# Patient Record
Sex: Female | Born: 1935 | Race: White | Hispanic: No | Smoking: Never smoker
Health system: Southern US, Community
[De-identification: ages and names within clinical notes are randomized; demographics above are authoritative.]

## PROBLEM LIST (undated history)

## (undated) DIAGNOSIS — I10 Essential (primary) hypertension: Secondary | ICD-10-CM

## (undated) DIAGNOSIS — K219 Gastro-esophageal reflux disease without esophagitis: Secondary | ICD-10-CM

## (undated) HISTORY — PX: BLADDER REPAIR: SHX76

## (undated) HISTORY — PX: ABDOMINAL HYSTERECTOMY: SHX81

---

## 2006-06-22 ENCOUNTER — Ambulatory Visit (HOSPITAL_COMMUNITY): Admission: RE | Admit: 2006-06-22 | Discharge: 2006-06-22 | Payer: Self-pay | Admitting: Ophthalmology

## 2006-07-20 ENCOUNTER — Ambulatory Visit (HOSPITAL_COMMUNITY): Admission: RE | Admit: 2006-07-20 | Discharge: 2006-07-20 | Payer: Self-pay | Admitting: Ophthalmology

## 2015-09-30 DIAGNOSIS — I10 Essential (primary) hypertension: Secondary | ICD-10-CM | POA: Diagnosis not present

## 2015-09-30 DIAGNOSIS — M1990 Unspecified osteoarthritis, unspecified site: Secondary | ICD-10-CM | POA: Diagnosis not present

## 2015-12-31 DIAGNOSIS — I10 Essential (primary) hypertension: Secondary | ICD-10-CM | POA: Diagnosis not present

## 2015-12-31 DIAGNOSIS — E7800 Pure hypercholesterolemia, unspecified: Secondary | ICD-10-CM | POA: Diagnosis not present

## 2015-12-31 DIAGNOSIS — M159 Polyosteoarthritis, unspecified: Secondary | ICD-10-CM | POA: Diagnosis not present

## 2016-01-02 DIAGNOSIS — Z87891 Personal history of nicotine dependence: Secondary | ICD-10-CM | POA: Diagnosis not present

## 2016-01-02 DIAGNOSIS — F419 Anxiety disorder, unspecified: Secondary | ICD-10-CM | POA: Diagnosis not present

## 2016-01-02 DIAGNOSIS — I10 Essential (primary) hypertension: Secondary | ICD-10-CM | POA: Diagnosis not present

## 2016-01-02 DIAGNOSIS — Z6837 Body mass index (BMI) 37.0-37.9, adult: Secondary | ICD-10-CM | POA: Diagnosis not present

## 2016-01-02 DIAGNOSIS — M1990 Unspecified osteoarthritis, unspecified site: Secondary | ICD-10-CM | POA: Diagnosis not present

## 2016-01-02 DIAGNOSIS — N183 Chronic kidney disease, stage 3 (moderate): Secondary | ICD-10-CM | POA: Diagnosis not present

## 2016-01-20 DIAGNOSIS — E7800 Pure hypercholesterolemia, unspecified: Secondary | ICD-10-CM | POA: Diagnosis not present

## 2016-01-20 DIAGNOSIS — I10 Essential (primary) hypertension: Secondary | ICD-10-CM | POA: Diagnosis not present

## 2016-01-20 DIAGNOSIS — M159 Polyosteoarthritis, unspecified: Secondary | ICD-10-CM | POA: Diagnosis not present

## 2016-02-09 DIAGNOSIS — Z1211 Encounter for screening for malignant neoplasm of colon: Secondary | ICD-10-CM | POA: Diagnosis not present

## 2016-02-09 DIAGNOSIS — Z0000 Encounter for general adult medical examination without abnormal findings: Secondary | ICD-10-CM | POA: Diagnosis not present

## 2016-02-09 DIAGNOSIS — Z1389 Encounter for screening for other disorder: Secondary | ICD-10-CM | POA: Diagnosis not present

## 2016-02-09 DIAGNOSIS — Z7189 Other specified counseling: Secondary | ICD-10-CM | POA: Diagnosis not present

## 2016-02-09 DIAGNOSIS — Z6837 Body mass index (BMI) 37.0-37.9, adult: Secondary | ICD-10-CM | POA: Diagnosis not present

## 2016-02-09 DIAGNOSIS — Z299 Encounter for prophylactic measures, unspecified: Secondary | ICD-10-CM | POA: Diagnosis not present

## 2016-02-13 DIAGNOSIS — R5383 Other fatigue: Secondary | ICD-10-CM | POA: Diagnosis not present

## 2016-02-13 DIAGNOSIS — Z79899 Other long term (current) drug therapy: Secondary | ICD-10-CM | POA: Diagnosis not present

## 2016-02-13 DIAGNOSIS — E7800 Pure hypercholesterolemia, unspecified: Secondary | ICD-10-CM | POA: Diagnosis not present

## 2016-02-27 DIAGNOSIS — J069 Acute upper respiratory infection, unspecified: Secondary | ICD-10-CM | POA: Diagnosis not present

## 2016-02-27 DIAGNOSIS — Z299 Encounter for prophylactic measures, unspecified: Secondary | ICD-10-CM | POA: Diagnosis not present

## 2016-02-27 DIAGNOSIS — Z789 Other specified health status: Secondary | ICD-10-CM | POA: Diagnosis not present

## 2016-05-04 DIAGNOSIS — E2839 Other primary ovarian failure: Secondary | ICD-10-CM | POA: Diagnosis not present

## 2016-05-13 DIAGNOSIS — K219 Gastro-esophageal reflux disease without esophagitis: Secondary | ICD-10-CM | POA: Diagnosis not present

## 2016-05-13 DIAGNOSIS — Z79899 Other long term (current) drug therapy: Secondary | ICD-10-CM | POA: Diagnosis not present

## 2016-05-13 DIAGNOSIS — K649 Unspecified hemorrhoids: Secondary | ICD-10-CM | POA: Diagnosis not present

## 2016-05-13 DIAGNOSIS — I10 Essential (primary) hypertension: Secondary | ICD-10-CM | POA: Diagnosis not present

## 2016-05-21 ENCOUNTER — Emergency Department (HOSPITAL_COMMUNITY): Payer: Medicare Other

## 2016-05-21 ENCOUNTER — Emergency Department (HOSPITAL_COMMUNITY)
Admission: EM | Admit: 2016-05-21 | Discharge: 2016-05-21 | Disposition: A | Discharge status: Home / Self Care | Payer: Medicare Other | Attending: Emergency Medicine | Admitting: Emergency Medicine

## 2016-05-21 ENCOUNTER — Encounter (HOSPITAL_COMMUNITY): Payer: Self-pay | Admitting: Emergency Medicine

## 2016-05-21 DIAGNOSIS — K649 Unspecified hemorrhoids: Secondary | ICD-10-CM | POA: Diagnosis not present

## 2016-05-21 DIAGNOSIS — K644 Residual hemorrhoidal skin tags: Secondary | ICD-10-CM | POA: Diagnosis not present

## 2016-05-21 DIAGNOSIS — I10 Essential (primary) hypertension: Secondary | ICD-10-CM | POA: Insufficient documentation

## 2016-05-21 DIAGNOSIS — K5730 Diverticulosis of large intestine without perforation or abscess without bleeding: Secondary | ICD-10-CM | POA: Diagnosis not present

## 2016-05-21 DIAGNOSIS — Z79899 Other long term (current) drug therapy: Secondary | ICD-10-CM | POA: Diagnosis not present

## 2016-05-21 DIAGNOSIS — K921 Melena: Secondary | ICD-10-CM

## 2016-05-21 DIAGNOSIS — K625 Hemorrhage of anus and rectum: Secondary | ICD-10-CM | POA: Diagnosis present

## 2016-05-21 DIAGNOSIS — R197 Diarrhea, unspecified: Secondary | ICD-10-CM | POA: Diagnosis not present

## 2016-05-21 HISTORY — DX: Essential (primary) hypertension: I10

## 2016-05-21 HISTORY — DX: Gastro-esophageal reflux disease without esophagitis: K21.9

## 2016-05-21 LAB — COMPREHENSIVE METABOLIC PANEL
ALBUMIN: 3.4 g/dL — AB (ref 3.5–5.0)
ALT: 14 U/L (ref 14–54)
ANION GAP: 9 (ref 5–15)
AST: 23 U/L (ref 15–41)
Alkaline Phosphatase: 50 U/L (ref 38–126)
BUN: 14 mg/dL (ref 6–20)
CO2: 31 mmol/L (ref 22–32)
Calcium: 9.2 mg/dL (ref 8.9–10.3)
Chloride: 95 mmol/L — ABNORMAL LOW (ref 101–111)
Creatinine, Ser: 1.27 mg/dL — ABNORMAL HIGH (ref 0.44–1.00)
GFR calc Af Amer: 42 mL/min — ABNORMAL LOW (ref >60)
GFR calc non Af Amer: 36 mL/min — ABNORMAL LOW (ref >60)
GLUCOSE: 114 mg/dL — AB (ref 65–99)
POTASSIUM: 3.3 mmol/L — AB (ref 3.5–5.1)
SODIUM: 135 mmol/L (ref 135–145)
Total Bilirubin: 0.5 mg/dL (ref 0.3–1.2)
Total Protein: 6.3 g/dL — ABNORMAL LOW (ref 6.5–8.1)

## 2016-05-21 LAB — CBC WITH DIFFERENTIAL/PLATELET
BASOS ABS: 0 10*3/uL (ref 0.0–0.1)
BASOS PCT: 0 %
EOS ABS: 0.1 10*3/uL (ref 0.0–0.7)
Eosinophils Relative: 1 %
HEMATOCRIT: 39.6 % (ref 36.0–46.0)
Hemoglobin: 12.8 g/dL (ref 12.0–15.0)
Lymphocytes Relative: 15 %
Lymphs Abs: 2 10*3/uL (ref 0.7–4.0)
MCH: 29.4 pg (ref 26.0–34.0)
MCHC: 32.3 g/dL (ref 30.0–36.0)
MCV: 90.8 fL (ref 78.0–100.0)
MONO ABS: 1.3 10*3/uL — AB (ref 0.1–1.0)
Monocytes Relative: 10 %
NEUTROS ABS: 9.7 10*3/uL — AB (ref 1.7–7.7)
NEUTROS PCT: 74 %
Platelets: 310 10*3/uL (ref 150–400)
RBC: 4.36 MIL/uL (ref 3.87–5.11)
RDW: 13.6 % (ref 11.5–15.5)
WBC: 13.1 10*3/uL — ABNORMAL HIGH (ref 4.0–10.5)

## 2016-05-21 LAB — PROTIME-INR
INR: 0.99
PROTHROMBIN TIME: 13.1 s (ref 11.4–15.2)

## 2016-05-21 LAB — TYPE AND SCREEN
ABO/RH(D): A POS
Antibody Screen: NEGATIVE

## 2016-05-21 LAB — POC OCCULT BLOOD, ED: FECAL OCCULT BLD: POSITIVE — AB

## 2016-05-21 MED ORDER — TRAMADOL HCL 50 MG PO TABS: 25.0000 mg | ORAL_TABLET | Freq: Four times a day (QID) | ORAL | 0 refills | Status: AC | PRN

## 2016-05-21 MED ORDER — IOPAMIDOL (ISOVUE-300) INJECTION 61%
INTRAVENOUS | Status: AC
  Administered 2016-05-21: 100 mL
  Filled 2016-05-21: qty 30

## 2016-05-21 MED ORDER — PRAMOXINE HCL 1 % RE FOAM: 1.0000 "application " | Freq: Three times a day (TID) | RECTAL | 2 refills | Status: AC | PRN

## 2016-05-21 NOTE — ED Triage Notes (Signed)
Pt states she has been having black tarry stools x one week. Pt was seen for the same at [facility] this week. PCP sent here for rectal bleeding.

## 2016-05-21 NOTE — ED Provider Notes (Signed)
[facility] DEPT Provider Note   CSN: [id number] Arrival date & time: 05/21/16  1857     History   Chief Complaint Chief Complaint  Patient presents with  . Rectal Bleeding    HPI Brandy Reid is a 82 y.o. female.   Diarrhea   This is a new problem. The current episode started more than 2 days ago. The problem occurs 2 to 4 times per day. The problem has not changed since onset.The stool consistency is described as watery (dark). There has been no fever. Pertinent negatives include no abdominal pain, no vomiting and no chills. She has tried anti-motility drugs, a change of diet and analgesics for the symptoms. Her past medical history does not include irritable bowel syndrome.    Past Medical History:  Diagnosis Date  . GERD (gastroesophageal reflux disease)   . Hypertension     There are no active problems to display for this patient.   Past Surgical History:  Procedure Laterality Date  . ABDOMINAL HYSTERECTOMY    . BLADDER REPAIR      OB History    No data available       Home Medications    Prior to Admission medications   Medication Sig Start Date End Date Taking? Authorizing Provider  clonazePAM (KLONOPIN) 0.5 MG tablet Take 0.5 mg by mouth at bedtime.   Yes Historical Provider, MD  docusate sodium (COLACE) 100 MG capsule Take 100 mg by mouth daily.   Yes Historical Provider, MD  hydrocortisone (ANUSOL-HC) 25 MG suppository Place 25 mg rectally 2 (two) times daily as needed for hemorrhoids or itching.   Yes Historical Provider, MD  ibuprofen (ADVIL,MOTRIN) 200 MG tablet Take 400 mg by mouth 2 (two) times daily as needed for moderate pain.    Yes Historical Provider, MD  lisinopril-hydrochlorothiazide (PRINZIDE,ZESTORETIC) 10-12.5 MG tablet Take 1 tablet by mouth daily.   Yes Historical Provider, MD  omeprazole (PRILOSEC) 20 MG capsule Take 20 mg by mouth daily with supper.   Yes Historical Provider, MD  senna-docusate (SENNALAX-S) 8.6-50 MG tablet Take  1 tablet by mouth daily.   Yes Historical Provider, MD  pramoxine (PROCTOFOAM) 1 % foam Place 1 application rectally 3 (three) times daily as needed for itching. 05/21/16   Merrily Pew, MD  traMADol (ULTRAM) 50 MG tablet Take 0.5 tablets (25 mg total) by mouth every 6 (six) hours as needed. 05/21/16   Merrily Pew, MD    Family History History reviewed. No pertinent family history.  Social History Social History  Substance Use Topics  . Smoking status: Never Smoker  . Smokeless tobacco: Never Used  . Alcohol use No     Allergies   Review of patient's allergies indicates no known allergies.   Review of Systems Review of Systems  Constitutional: Negative for chills.  Gastrointestinal: Positive for diarrhea and rectal pain. Negative for abdominal pain and vomiting.  All other systems reviewed and are negative.    Physical Exam Updated Vital Signs BP 144/73   Pulse 86   Temp 97.4 F (36.3 C) (Oral)   Resp 18   SpO2 96%   Physical Exam  Constitutional: She appears well-developed and well-nourished.  HENT:  Head: Normocephalic and atraumatic.  Eyes: Conjunctivae and EOM are normal.  Neck: Normal range of motion.  Cardiovascular: Normal rate and regular rhythm.  Exam reveals no friction rub.   No murmur heard. Pulmonary/Chest: Effort normal. No stridor. No respiratory distress.  Abdominal: Soft. She exhibits no distension and no  mass. There is no tenderness.  Genitourinary: Rectal exam shows external hemorrhoid (tender but not thrombosed) and guaiac positive stool (faintly positive).  Musculoskeletal: Normal range of motion. She exhibits no edema or deformity.  Neurological: She is alert.  Skin: Skin is warm and dry.  Nursing note and vitals reviewed.    ED Treatments / Results  Labs (all labs ordered are listed, but only abnormal results are displayed) Labs Reviewed  COMPREHENSIVE METABOLIC PANEL - Abnormal; Notable for the following:       Result Value    Potassium 3.3 (*)    Chloride 95 (*)    Glucose, Bld 114 (*)    Creatinine, Ser 1.27 (*)    Total Protein 6.3 (*)    Albumin 3.4 (*)    GFR calc non Af Amer 36 (*)    GFR calc Af Amer 42 (*)    All other components within normal limits  CBC WITH DIFFERENTIAL/PLATELET - Abnormal; Notable for the following:    WBC 13.1 (*)    Neutro Abs 9.7 (*)    Monocytes Absolute 1.3 (*)    All other components within normal limits  POC OCCULT BLOOD, ED - Abnormal; Notable for the following:    Fecal Occult Bld POSITIVE (*)    All other components within normal limits  PROTIME-INR  TYPE AND SCREEN    EKG  EKG Interpretation None       Radiology Ct Abdomen Pelvis W Contrast  Result Date: 05/21/2016 CLINICAL DATA:  82 year old female with lack scarring stool. EXAM: CT ABDOMEN AND PELVIS WITH CONTRAST TECHNIQUE: Multidetector CT imaging of the abdomen and pelvis was performed using the standard protocol following bolus administration of intravenous contrast. CONTRAST:  142mL ISOVUE-300 IOPAMIDOL (ISOVUE-300) INJECTION 61% COMPARISON:  None. FINDINGS: Lower chest: The visualized lung bases are clear. There is mild cardiomegaly. Coronary vascular calcification noted. No intra-abdominal free air or free fluid. Hepatobiliary: Cholecystectomy. The liver is unremarkable. No intrahepatic biliary ductal dilatation. Pancreas: Unremarkable. No pancreatic ductal dilatation or surrounding inflammatory changes. Spleen: Normal in size without focal abnormality. Adrenals/Urinary Tract: The adrenal glands appear unremarkable. There is moderate bilateral renal parenchymal atrophy and cortical thinning. Bilateral extrarenal pelvis. No stone identified. There is mild right pelvocaliectasis correlation with urinalysis recommended to exclude UTI. There is no hydronephrosis on the left. The urinary bladder appears unremarkable. Stomach/Bowel: There is a moderate size paraesophageal hiatal hernia containing portion of the  stomach with possible gastric dialysis. No inflammatory changes, fluid collection, or evidence of gastric outlet obstruction.Moderate amount of dense stool noted throughout the colon. There is extensive colonic diverticulosis with a segment of muscular hypertrophy in the sigmoid colon. No definite active inflammatory changes. There is no evidence of bowel obstruction. The appendix is not visualized with certainty. No inflammatory changes identified in the right lower quadrant. Vascular/Lymphatic: There is advanced aortoiliac atherosclerotic disease. The origins of the celiac axis, SMA, IMA appear patent. The SMV, splenic vein, and main portal vein are patent. No portal venous gas identified. There is no adenopathy. Reproductive: Hysterectomy. Other: None Musculoskeletal: there is scoliosis with degenerative changes of the spine. Multilevel disc desiccation with vacuum phenomena. Grade 1 L4-L5 anterolisthesis. No acute fracture. IMPRESSION: No acute intra-abdominal or pelvic pathology. Extensive colonic diverticulosis without definite active inflammatory changes. Constipation.  No evidence of bowel obstruction. Moderate size hiatal hernia containing portion of the stomach with possible gastric volvulus with no inflammatory changes or evidence of gastric outlet obstruction. Mild right renal pelvocaliectasis. Correlation with urinalysis recommended  to exclude UTI. Electronically Signed   By: Anner Crete M.D.   On: 05/21/2016 22:32    Procedures Procedures (including critical care time)  Medications Ordered in ED Medications  iopamidol (ISOVUE-300) 61 % injection (100 mLs  Contrast Given 05/21/16 2143)     Initial Impression / Assessment and Plan / ED Course  I have reviewed the triage vital signs and the nursing notes.  Pertinent labs & imaging results that were available during my care of the patient were reviewed by me and considered in my medical decision making (see chart for  details).  Clinical Course    Here with a few days of diarrhea and painful defecation 2/2 hemorrhoids. Stools dark but had taken pepto recently. Faintly positive for blood but no hemodynamic instability, tachycardia, anemia or blood thinners to warrant emergent gi eval. hemrrhoids not thrombosed so no emergent intervention. After long discussion with family, will get try to have care management get a nurse aid/PT into the house to help patient with ADL's, she needs urgent Gi follow up which they will arrange. Will also return here for new/worsening symptoms.   Final Clinical Impressions(s) / ED Diagnoses   Final diagnoses:  Hemorrhoids, unspecified hemorrhoid type  Melena    New Prescriptions Discharge Medication List as of 05/21/2016 11:08 PM    START taking these medications   Details  pramoxine (PROCTOFOAM) 1 % foam Place 1 application rectally 3 (three) times daily as needed for itching., Starting Fri 05/21/2016, Print    traMADol (ULTRAM) 50 MG tablet Take 0.5 tablets (25 mg total) by mouth every 6 (six) hours as needed., Starting Fri 05/21/2016, Print         Merrily Pew, MD 05/22/16 1141

## 2016-05-26 ENCOUNTER — Ambulatory Visit (INDEPENDENT_AMBULATORY_CARE_PROVIDER_SITE_OTHER): Payer: Medicare Other | Admitting: Internal Medicine

## 2016-07-21 DIAGNOSIS — I10 Essential (primary) hypertension: Secondary | ICD-10-CM | POA: Diagnosis not present

## 2016-07-21 DIAGNOSIS — E7800 Pure hypercholesterolemia, unspecified: Secondary | ICD-10-CM | POA: Diagnosis not present

## 2016-07-21 DIAGNOSIS — M159 Polyosteoarthritis, unspecified: Secondary | ICD-10-CM | POA: Diagnosis not present

## 2016-11-22 DIAGNOSIS — I10 Essential (primary) hypertension: Secondary | ICD-10-CM | POA: Diagnosis not present

## 2016-11-22 DIAGNOSIS — M159 Polyosteoarthritis, unspecified: Secondary | ICD-10-CM | POA: Diagnosis not present

## 2016-11-22 DIAGNOSIS — E7800 Pure hypercholesterolemia, unspecified: Secondary | ICD-10-CM | POA: Diagnosis not present

## 2016-12-22 DIAGNOSIS — I10 Essential (primary) hypertension: Secondary | ICD-10-CM | POA: Diagnosis not present

## 2016-12-22 DIAGNOSIS — E7800 Pure hypercholesterolemia, unspecified: Secondary | ICD-10-CM | POA: Diagnosis not present

## 2016-12-22 DIAGNOSIS — M159 Polyosteoarthritis, unspecified: Secondary | ICD-10-CM | POA: Diagnosis not present

## 2017-02-18 DIAGNOSIS — Z79899 Other long term (current) drug therapy: Secondary | ICD-10-CM | POA: Diagnosis not present

## 2017-02-18 DIAGNOSIS — R5383 Other fatigue: Secondary | ICD-10-CM | POA: Diagnosis not present

## 2017-02-18 DIAGNOSIS — Z7189 Other specified counseling: Secondary | ICD-10-CM | POA: Diagnosis not present

## 2017-02-18 DIAGNOSIS — Z6834 Body mass index (BMI) 34.0-34.9, adult: Secondary | ICD-10-CM | POA: Diagnosis not present

## 2017-02-18 DIAGNOSIS — E7800 Pure hypercholesterolemia, unspecified: Secondary | ICD-10-CM | POA: Diagnosis not present

## 2017-02-18 DIAGNOSIS — Z1389 Encounter for screening for other disorder: Secondary | ICD-10-CM | POA: Diagnosis not present

## 2017-02-18 DIAGNOSIS — Z789 Other specified health status: Secondary | ICD-10-CM | POA: Diagnosis not present

## 2017-02-18 DIAGNOSIS — Z0000 Encounter for general adult medical examination without abnormal findings: Secondary | ICD-10-CM | POA: Diagnosis not present

## 2017-02-18 DIAGNOSIS — I10 Essential (primary) hypertension: Secondary | ICD-10-CM | POA: Diagnosis not present

## 2017-02-18 DIAGNOSIS — N183 Chronic kidney disease, stage 3 (moderate): Secondary | ICD-10-CM | POA: Diagnosis not present

## 2017-02-18 DIAGNOSIS — Z299 Encounter for prophylactic measures, unspecified: Secondary | ICD-10-CM | POA: Diagnosis not present

## 2017-02-18 DIAGNOSIS — F419 Anxiety disorder, unspecified: Secondary | ICD-10-CM | POA: Diagnosis not present

## 2017-02-21 DIAGNOSIS — M159 Polyosteoarthritis, unspecified: Secondary | ICD-10-CM | POA: Diagnosis not present

## 2017-02-21 DIAGNOSIS — I10 Essential (primary) hypertension: Secondary | ICD-10-CM | POA: Diagnosis not present

## 2017-02-21 DIAGNOSIS — E7800 Pure hypercholesterolemia, unspecified: Secondary | ICD-10-CM | POA: Diagnosis not present

## 2017-03-15 DIAGNOSIS — R5383 Other fatigue: Secondary | ICD-10-CM | POA: Diagnosis not present

## 2017-03-15 DIAGNOSIS — Z79899 Other long term (current) drug therapy: Secondary | ICD-10-CM | POA: Diagnosis not present

## 2017-03-15 DIAGNOSIS — E7800 Pure hypercholesterolemia, unspecified: Secondary | ICD-10-CM | POA: Diagnosis not present

## 2017-03-15 DIAGNOSIS — F419 Anxiety disorder, unspecified: Secondary | ICD-10-CM | POA: Diagnosis not present

## 2017-03-25 DIAGNOSIS — I10 Essential (primary) hypertension: Secondary | ICD-10-CM | POA: Diagnosis not present

## 2017-03-25 DIAGNOSIS — M159 Polyosteoarthritis, unspecified: Secondary | ICD-10-CM | POA: Diagnosis not present

## 2017-03-25 DIAGNOSIS — E7800 Pure hypercholesterolemia, unspecified: Secondary | ICD-10-CM | POA: Diagnosis not present

## 2017-03-28 DIAGNOSIS — I340 Nonrheumatic mitral (valve) insufficiency: Secondary | ICD-10-CM | POA: Diagnosis not present

## 2017-03-28 DIAGNOSIS — I350 Nonrheumatic aortic (valve) stenosis: Secondary | ICD-10-CM | POA: Diagnosis not present

## 2017-05-23 DIAGNOSIS — I10 Essential (primary) hypertension: Secondary | ICD-10-CM | POA: Diagnosis not present

## 2017-05-23 DIAGNOSIS — M159 Polyosteoarthritis, unspecified: Secondary | ICD-10-CM | POA: Diagnosis not present

## 2017-05-23 DIAGNOSIS — E7800 Pure hypercholesterolemia, unspecified: Secondary | ICD-10-CM | POA: Diagnosis not present

## 2017-06-02 DIAGNOSIS — M159 Polyosteoarthritis, unspecified: Secondary | ICD-10-CM | POA: Diagnosis not present

## 2017-06-02 DIAGNOSIS — E7800 Pure hypercholesterolemia, unspecified: Secondary | ICD-10-CM | POA: Diagnosis not present

## 2017-06-02 DIAGNOSIS — I10 Essential (primary) hypertension: Secondary | ICD-10-CM | POA: Diagnosis not present

## 2017-11-08 DIAGNOSIS — I10 Essential (primary) hypertension: Secondary | ICD-10-CM | POA: Diagnosis not present

## 2017-11-08 DIAGNOSIS — E7800 Pure hypercholesterolemia, unspecified: Secondary | ICD-10-CM | POA: Diagnosis not present

## 2017-11-08 DIAGNOSIS — M159 Polyosteoarthritis, unspecified: Secondary | ICD-10-CM | POA: Diagnosis not present

## 2018-01-31 IMAGING — CT CT ABD-PELV W/ CM
2 of 5 series · 16 of 46 positions shown, 18 images · IV contrast (APPLIED)
Comparison: None.

CLINICAL DATA: [AGE] female with lack scarring stool.

EXAM:
CT ABDOMEN AND PELVIS WITH CONTRAST
TECHNIQUE: Multidetector CT imaging of the abdomen and pelvis was performed
using the standard protocol following bolus administration of
intravenous contrast.
CONTRAST:  100mL RQAJ1U-YMM IOPAMIDOL (RQAJ1U-YMM) INJECTION 61%

[Series 2: axial st · axial · 0.82mm/px · z∈[+598,+968]mm · 13 of 84 slices shown, 15 images]
[im 5/84  soft-tissue]
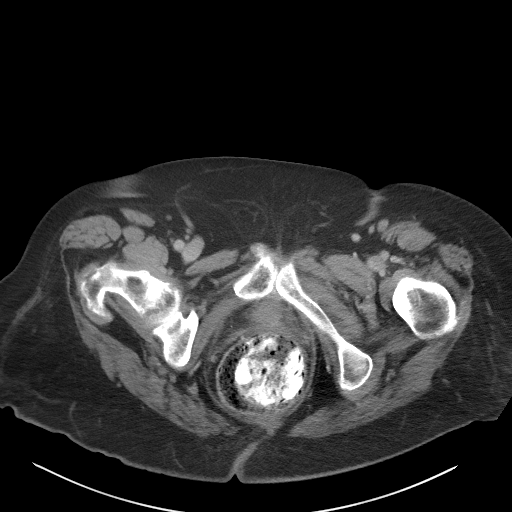
[im 5/84  bone]
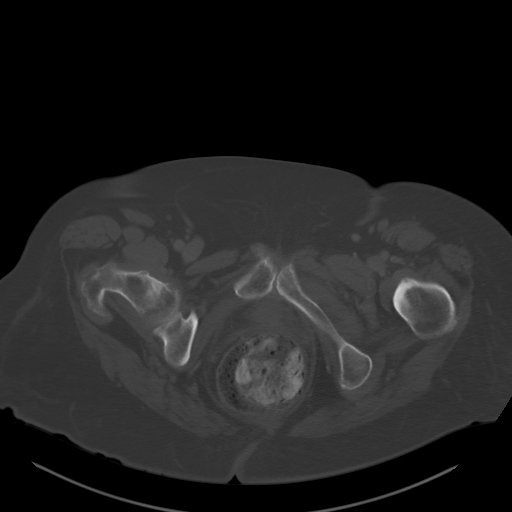
[im 10/84  soft-tissue]
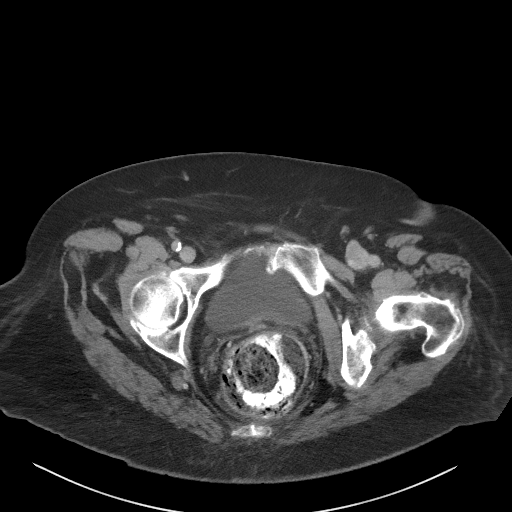
[im 20/84  soft-tissue]
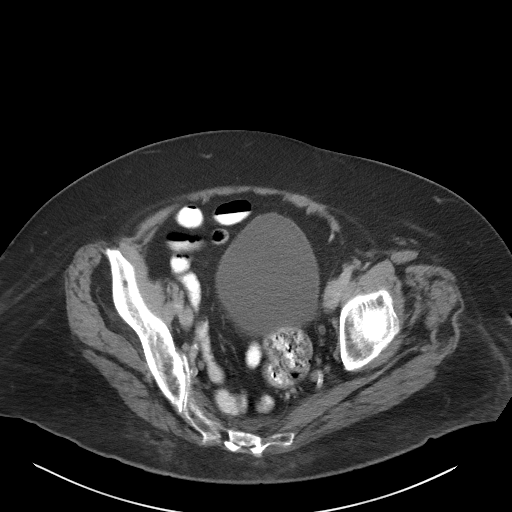
[im 25/84  soft-tissue]
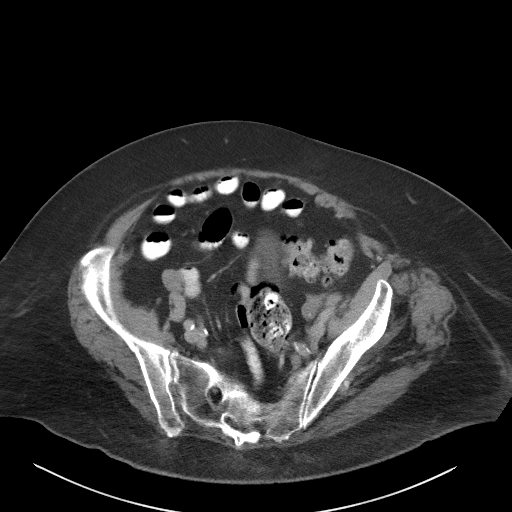
[im 30/84  soft-tissue]
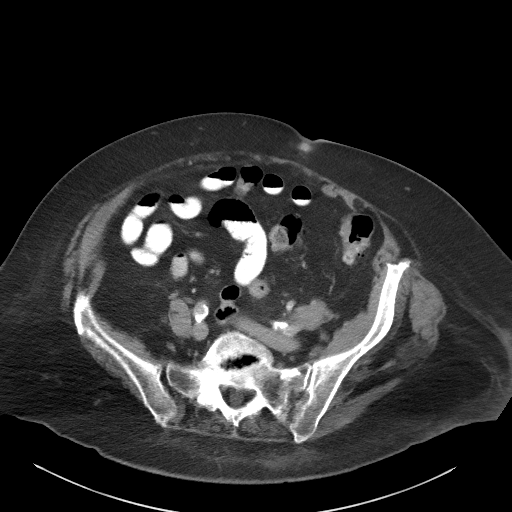
[im 35/84  soft-tissue]
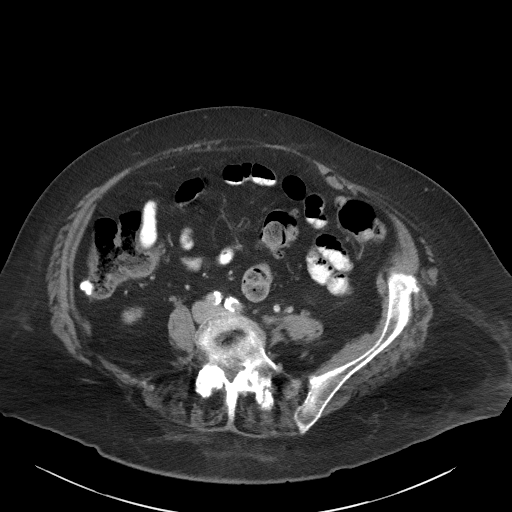
[im 44/84  soft-tissue]
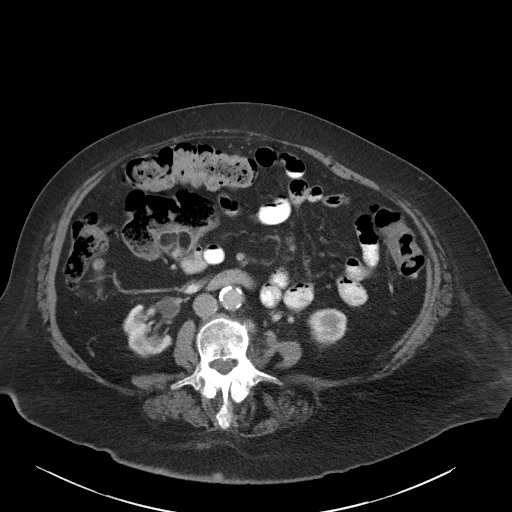
[im 49/84  soft-tissue]
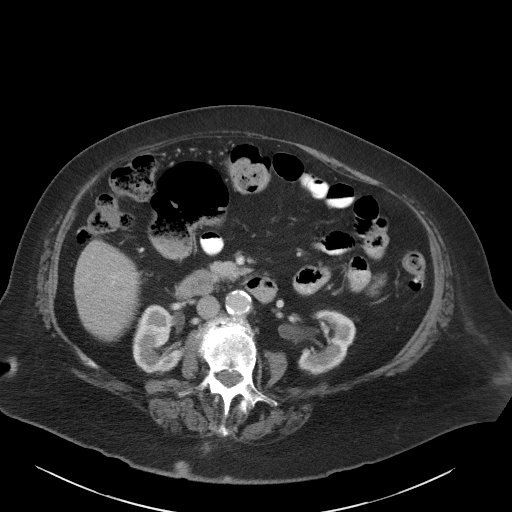
[im 54/84  soft-tissue]
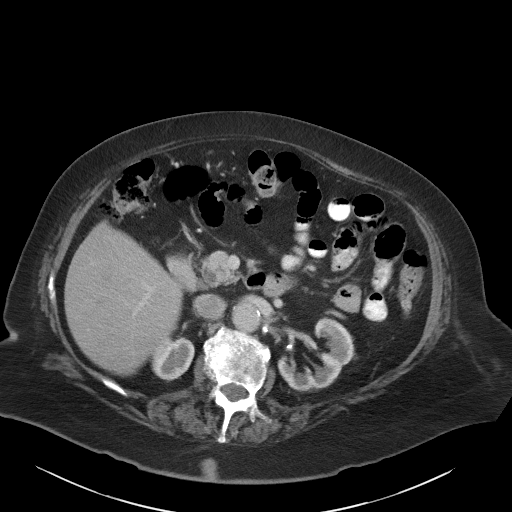
[im 54/84  bone]
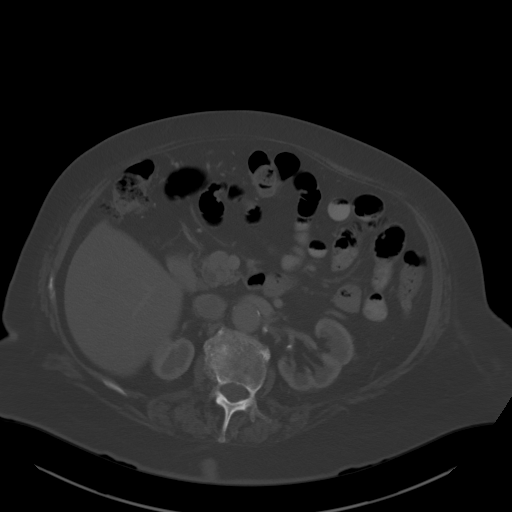
[im 59/84  soft-tissue]
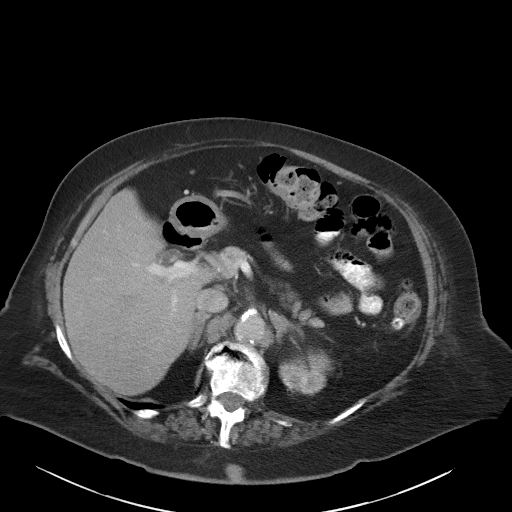
[im 64/84  soft-tissue]
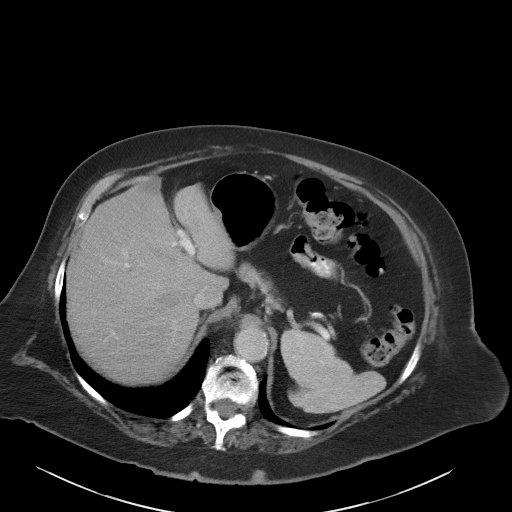
[im 74/84  soft-tissue]
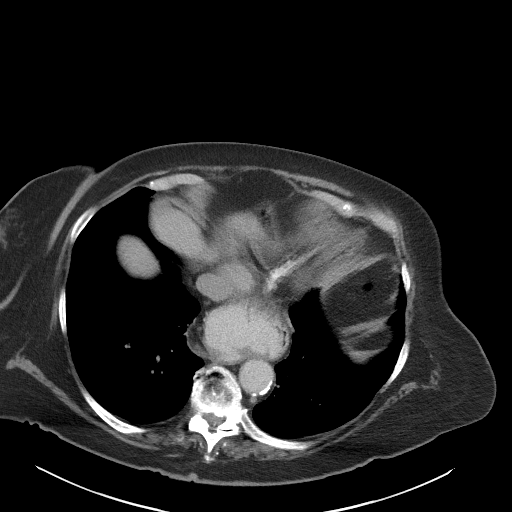
[im 79/84  soft-tissue]
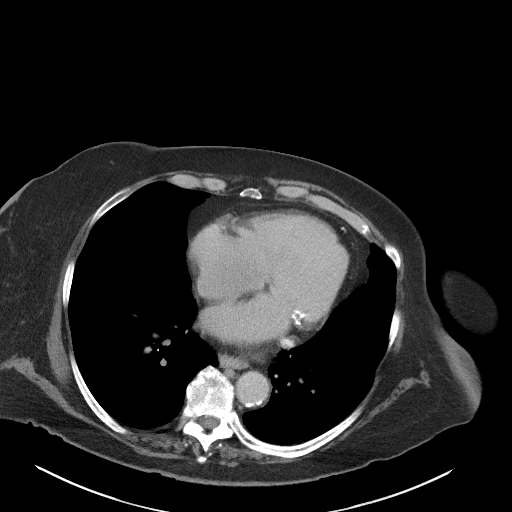

[Series 4: coronal st · coronal · 0.86mm/px · 3 of 119 slices shown]
[im 40/119  soft-tissue]
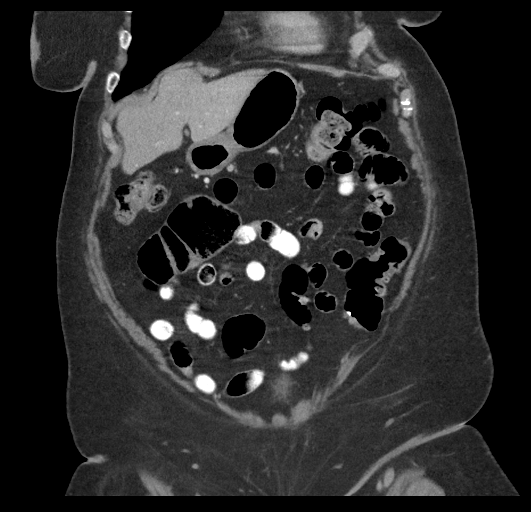
[im 53/119  soft-tissue]
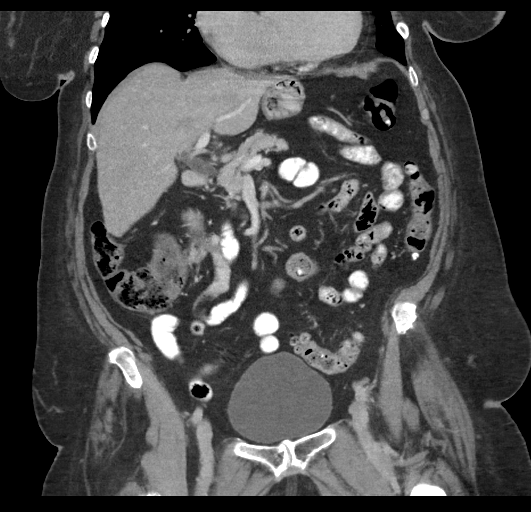
[im 66/119  soft-tissue]
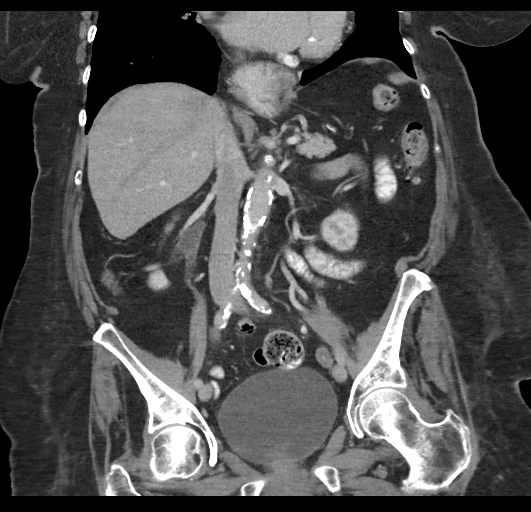

[16 of 46 positions shown; findings below may reference images not displayed]

FINDINGS: Lower chest: The visualized lung bases are clear. There is mild
cardiomegaly. Coronary vascular calcification noted.

No intra-abdominal free air or free fluid.

Hepatobiliary: Cholecystectomy. The liver is unremarkable. No
intrahepatic biliary ductal dilatation.

Pancreas: Unremarkable. No pancreatic ductal dilatation or
surrounding inflammatory changes.

Spleen: Normal in size without focal abnormality.

Adrenals/Urinary Tract: The adrenal glands appear unremarkable.
There is moderate bilateral renal parenchymal atrophy and cortical
thinning. Bilateral extrarenal pelvis. No stone identified. There is
mild right pelvocaliectasis correlation with urinalysis recommended
to exclude UTI. There is no hydronephrosis on the left. The urinary
bladder appears unremarkable.

Stomach/Bowel: There is a moderate size paraesophageal hiatal hernia
containing portion of the stomach with possible gastric dialysis. No
inflammatory changes, fluid collection, or evidence of gastric
outlet obstruction.Moderate amount of dense stool noted throughout
the colon. There is extensive colonic diverticulosis with a segment
of muscular hypertrophy in the sigmoid colon. No definite active
inflammatory changes. There is no evidence of bowel obstruction. The
appendix is not visualized with certainty. No inflammatory changes
identified in the right lower quadrant.

Vascular/Lymphatic: There is advanced aortoiliac atherosclerotic
disease. The origins of the celiac axis, SMA, IMA appear patent. The
SMV, splenic vein, and main portal vein are patent. No portal venous
gas identified. There is no adenopathy.

Reproductive: Hysterectomy.

Other: None

Musculoskeletal: there is scoliosis with degenerative changes of the
spine. Multilevel disc desiccation with vacuum phenomena. Grade 1
L4-L5 anterolisthesis. No acute fracture.
IMPRESSION: No acute intra-abdominal or pelvic pathology. Extensive colonic
diverticulosis without definite active inflammatory changes.

Constipation.  No evidence of bowel obstruction.

Moderate size hiatal hernia containing portion of the stomach with
possible gastric volvulus with no inflammatory changes or evidence
of gastric outlet obstruction.

Mild right renal pelvocaliectasis. Correlation with urinalysis
recommended to exclude UTI.

## 2018-02-16 DIAGNOSIS — E7800 Pure hypercholesterolemia, unspecified: Secondary | ICD-10-CM | POA: Diagnosis not present

## 2018-02-16 DIAGNOSIS — M159 Polyosteoarthritis, unspecified: Secondary | ICD-10-CM | POA: Diagnosis not present

## 2018-02-16 DIAGNOSIS — I10 Essential (primary) hypertension: Secondary | ICD-10-CM | POA: Diagnosis not present

## 2018-03-15 DIAGNOSIS — I10 Essential (primary) hypertension: Secondary | ICD-10-CM | POA: Diagnosis not present

## 2018-03-15 DIAGNOSIS — E7800 Pure hypercholesterolemia, unspecified: Secondary | ICD-10-CM | POA: Diagnosis not present

## 2018-03-15 DIAGNOSIS — M159 Polyosteoarthritis, unspecified: Secondary | ICD-10-CM | POA: Diagnosis not present

## 2018-04-25 DIAGNOSIS — E7800 Pure hypercholesterolemia, unspecified: Secondary | ICD-10-CM | POA: Diagnosis not present

## 2018-04-25 DIAGNOSIS — M159 Polyosteoarthritis, unspecified: Secondary | ICD-10-CM | POA: Diagnosis not present

## 2018-04-25 DIAGNOSIS — I10 Essential (primary) hypertension: Secondary | ICD-10-CM | POA: Diagnosis not present

## 2018-06-08 DIAGNOSIS — M159 Polyosteoarthritis, unspecified: Secondary | ICD-10-CM | POA: Diagnosis not present

## 2018-06-08 DIAGNOSIS — E7800 Pure hypercholesterolemia, unspecified: Secondary | ICD-10-CM | POA: Diagnosis not present

## 2018-06-08 DIAGNOSIS — I10 Essential (primary) hypertension: Secondary | ICD-10-CM | POA: Diagnosis not present

## 2018-07-19 DIAGNOSIS — I10 Essential (primary) hypertension: Secondary | ICD-10-CM | POA: Diagnosis not present

## 2018-07-19 DIAGNOSIS — M159 Polyosteoarthritis, unspecified: Secondary | ICD-10-CM | POA: Diagnosis not present

## 2018-07-19 DIAGNOSIS — E7800 Pure hypercholesterolemia, unspecified: Secondary | ICD-10-CM | POA: Diagnosis not present

## 2018-10-09 DIAGNOSIS — I10 Essential (primary) hypertension: Secondary | ICD-10-CM | POA: Diagnosis not present

## 2018-10-09 DIAGNOSIS — M159 Polyosteoarthritis, unspecified: Secondary | ICD-10-CM | POA: Diagnosis not present

## 2018-10-09 DIAGNOSIS — E7800 Pure hypercholesterolemia, unspecified: Secondary | ICD-10-CM | POA: Diagnosis not present

## 2018-11-20 DIAGNOSIS — E7800 Pure hypercholesterolemia, unspecified: Secondary | ICD-10-CM | POA: Diagnosis not present

## 2018-11-20 DIAGNOSIS — I10 Essential (primary) hypertension: Secondary | ICD-10-CM | POA: Diagnosis not present

## 2018-11-20 DIAGNOSIS — M159 Polyosteoarthritis, unspecified: Secondary | ICD-10-CM | POA: Diagnosis not present

## 2018-12-22 DIAGNOSIS — E7800 Pure hypercholesterolemia, unspecified: Secondary | ICD-10-CM | POA: Diagnosis not present

## 2018-12-22 DIAGNOSIS — M159 Polyosteoarthritis, unspecified: Secondary | ICD-10-CM | POA: Diagnosis not present

## 2018-12-22 DIAGNOSIS — I10 Essential (primary) hypertension: Secondary | ICD-10-CM | POA: Diagnosis not present

## 2019-02-20 DIAGNOSIS — M159 Polyosteoarthritis, unspecified: Secondary | ICD-10-CM | POA: Diagnosis not present

## 2019-02-20 DIAGNOSIS — E7800 Pure hypercholesterolemia, unspecified: Secondary | ICD-10-CM | POA: Diagnosis not present

## 2019-02-20 DIAGNOSIS — I10 Essential (primary) hypertension: Secondary | ICD-10-CM | POA: Diagnosis not present

## 2019-03-20 DIAGNOSIS — M159 Polyosteoarthritis, unspecified: Secondary | ICD-10-CM | POA: Diagnosis not present

## 2019-03-20 DIAGNOSIS — I10 Essential (primary) hypertension: Secondary | ICD-10-CM | POA: Diagnosis not present

## 2019-03-20 DIAGNOSIS — E7800 Pure hypercholesterolemia, unspecified: Secondary | ICD-10-CM | POA: Diagnosis not present

## 2019-05-01 DIAGNOSIS — I10 Essential (primary) hypertension: Secondary | ICD-10-CM | POA: Diagnosis not present

## 2019-05-18 DIAGNOSIS — Z299 Encounter for prophylactic measures, unspecified: Secondary | ICD-10-CM | POA: Diagnosis not present

## 2019-05-18 DIAGNOSIS — Z6834 Body mass index (BMI) 34.0-34.9, adult: Secondary | ICD-10-CM | POA: Diagnosis not present

## 2019-05-18 DIAGNOSIS — Z7189 Other specified counseling: Secondary | ICD-10-CM | POA: Diagnosis not present

## 2019-05-18 DIAGNOSIS — E8940 Asymptomatic postprocedural ovarian failure: Secondary | ICD-10-CM | POA: Diagnosis not present

## 2019-05-18 DIAGNOSIS — F0390 Unspecified dementia without behavioral disturbance: Secondary | ICD-10-CM | POA: Diagnosis not present

## 2019-05-18 DIAGNOSIS — Z0000 Encounter for general adult medical examination without abnormal findings: Secondary | ICD-10-CM | POA: Diagnosis not present

## 2019-05-18 DIAGNOSIS — M1990 Unspecified osteoarthritis, unspecified site: Secondary | ICD-10-CM | POA: Diagnosis not present

## 2019-05-18 DIAGNOSIS — Z1331 Encounter for screening for depression: Secondary | ICD-10-CM | POA: Diagnosis not present

## 2019-05-18 DIAGNOSIS — Z1339 Encounter for screening examination for other mental health and behavioral disorders: Secondary | ICD-10-CM | POA: Diagnosis not present

## 2019-07-03 DIAGNOSIS — M159 Polyosteoarthritis, unspecified: Secondary | ICD-10-CM | POA: Diagnosis not present

## 2019-07-03 DIAGNOSIS — I10 Essential (primary) hypertension: Secondary | ICD-10-CM | POA: Diagnosis not present

## 2019-07-03 DIAGNOSIS — E7800 Pure hypercholesterolemia, unspecified: Secondary | ICD-10-CM | POA: Diagnosis not present

## 2019-08-20 DIAGNOSIS — M159 Polyosteoarthritis, unspecified: Secondary | ICD-10-CM | POA: Diagnosis not present

## 2019-08-20 DIAGNOSIS — E7800 Pure hypercholesterolemia, unspecified: Secondary | ICD-10-CM | POA: Diagnosis not present

## 2019-08-20 DIAGNOSIS — I10 Essential (primary) hypertension: Secondary | ICD-10-CM | POA: Diagnosis not present

## 2019-09-18 DIAGNOSIS — E7800 Pure hypercholesterolemia, unspecified: Secondary | ICD-10-CM | POA: Diagnosis not present

## 2019-09-18 DIAGNOSIS — M159 Polyosteoarthritis, unspecified: Secondary | ICD-10-CM | POA: Diagnosis not present

## 2019-09-18 DIAGNOSIS — I10 Essential (primary) hypertension: Secondary | ICD-10-CM | POA: Diagnosis not present

## 2019-10-01 DIAGNOSIS — I10 Essential (primary) hypertension: Secondary | ICD-10-CM | POA: Diagnosis not present

## 2019-10-01 DIAGNOSIS — M159 Polyosteoarthritis, unspecified: Secondary | ICD-10-CM | POA: Diagnosis not present

## 2019-10-01 DIAGNOSIS — E7800 Pure hypercholesterolemia, unspecified: Secondary | ICD-10-CM | POA: Diagnosis not present

## 2019-12-30 DIAGNOSIS — M159 Polyosteoarthritis, unspecified: Secondary | ICD-10-CM | POA: Diagnosis not present

## 2019-12-30 DIAGNOSIS — E7800 Pure hypercholesterolemia, unspecified: Secondary | ICD-10-CM | POA: Diagnosis not present

## 2019-12-30 DIAGNOSIS — I10 Essential (primary) hypertension: Secondary | ICD-10-CM | POA: Diagnosis not present

## 2020-01-07 DIAGNOSIS — S4991XA Unspecified injury of right shoulder and upper arm, initial encounter: Secondary | ICD-10-CM | POA: Diagnosis not present

## 2020-01-07 DIAGNOSIS — R296 Repeated falls: Secondary | ICD-10-CM | POA: Diagnosis not present

## 2020-01-07 DIAGNOSIS — S0083XA Contusion of other part of head, initial encounter: Secondary | ICD-10-CM | POA: Diagnosis not present

## 2020-01-07 DIAGNOSIS — M160 Bilateral primary osteoarthritis of hip: Secondary | ICD-10-CM | POA: Diagnosis not present

## 2020-01-07 DIAGNOSIS — M47816 Spondylosis without myelopathy or radiculopathy, lumbar region: Secondary | ICD-10-CM | POA: Diagnosis not present

## 2020-01-07 DIAGNOSIS — W19XXXA Unspecified fall, initial encounter: Secondary | ICD-10-CM | POA: Diagnosis not present

## 2020-01-07 DIAGNOSIS — M19011 Primary osteoarthritis, right shoulder: Secondary | ICD-10-CM | POA: Diagnosis not present

## 2020-01-07 DIAGNOSIS — R52 Pain, unspecified: Secondary | ICD-10-CM | POA: Diagnosis not present

## 2020-01-07 DIAGNOSIS — W1830XA Fall on same level, unspecified, initial encounter: Secondary | ICD-10-CM | POA: Diagnosis not present

## 2020-01-07 DIAGNOSIS — Z043 Encounter for examination and observation following other accident: Secondary | ICD-10-CM | POA: Diagnosis not present

## 2020-01-07 DIAGNOSIS — S20219A Contusion of unspecified front wall of thorax, initial encounter: Secondary | ICD-10-CM | POA: Diagnosis not present

## 2020-01-07 DIAGNOSIS — S3993XA Unspecified injury of pelvis, initial encounter: Secondary | ICD-10-CM | POA: Diagnosis not present

## 2020-01-09 DIAGNOSIS — K5792 Diverticulitis of intestine, part unspecified, without perforation or abscess without bleeding: Secondary | ICD-10-CM | POA: Diagnosis not present

## 2020-01-09 DIAGNOSIS — Z6833 Body mass index (BMI) 33.0-33.9, adult: Secondary | ICD-10-CM | POA: Diagnosis not present

## 2020-01-09 DIAGNOSIS — I700 Atherosclerosis of aorta: Secondary | ICD-10-CM | POA: Diagnosis not present

## 2020-01-09 DIAGNOSIS — Z299 Encounter for prophylactic measures, unspecified: Secondary | ICD-10-CM | POA: Diagnosis not present

## 2020-01-09 DIAGNOSIS — I779 Disorder of arteries and arterioles, unspecified: Secondary | ICD-10-CM | POA: Diagnosis not present

## 2020-01-09 DIAGNOSIS — I272 Pulmonary hypertension, unspecified: Secondary | ICD-10-CM | POA: Diagnosis not present

## 2020-01-18 DIAGNOSIS — R404 Transient alteration of awareness: Secondary | ICD-10-CM | POA: Diagnosis not present

## 2020-01-18 DIAGNOSIS — M25519 Pain in unspecified shoulder: Secondary | ICD-10-CM | POA: Diagnosis not present

## 2020-01-18 DIAGNOSIS — R0902 Hypoxemia: Secondary | ICD-10-CM | POA: Diagnosis not present

## 2020-01-18 DIAGNOSIS — I959 Hypotension, unspecified: Secondary | ICD-10-CM | POA: Diagnosis not present

## 2020-01-25 DIAGNOSIS — R5381 Other malaise: Secondary | ICD-10-CM | POA: Diagnosis not present

## 2020-01-25 DIAGNOSIS — R69 Illness, unspecified: Secondary | ICD-10-CM | POA: Diagnosis not present

## 2020-01-25 DIAGNOSIS — Z7401 Bed confinement status: Secondary | ICD-10-CM | POA: Diagnosis not present

## 2020-01-31 DIAGNOSIS — M6282 Rhabdomyolysis: Secondary | ICD-10-CM | POA: Diagnosis not present

## 2020-01-31 DIAGNOSIS — F0391 Unspecified dementia with behavioral disturbance: Secondary | ICD-10-CM | POA: Diagnosis not present

## 2020-01-31 DIAGNOSIS — R296 Repeated falls: Secondary | ICD-10-CM | POA: Diagnosis not present

## 2020-01-31 DIAGNOSIS — R638 Other symptoms and signs concerning food and fluid intake: Secondary | ICD-10-CM | POA: Diagnosis not present

## 2020-01-31 DIAGNOSIS — R2689 Other abnormalities of gait and mobility: Secondary | ICD-10-CM | POA: Diagnosis not present

## 2020-01-31 DIAGNOSIS — I10 Essential (primary) hypertension: Secondary | ICD-10-CM | POA: Diagnosis not present

## 2020-01-31 DIAGNOSIS — D72829 Elevated white blood cell count, unspecified: Secondary | ICD-10-CM | POA: Diagnosis not present

## 2020-01-31 DIAGNOSIS — I700 Atherosclerosis of aorta: Secondary | ICD-10-CM | POA: Diagnosis not present

## 2020-01-31 DIAGNOSIS — N184 Chronic kidney disease, stage 4 (severe): Secondary | ICD-10-CM | POA: Diagnosis not present

## 2020-01-31 DIAGNOSIS — Z299 Encounter for prophylactic measures, unspecified: Secondary | ICD-10-CM | POA: Diagnosis not present

## 2020-01-31 DIAGNOSIS — E871 Hypo-osmolality and hyponatremia: Secondary | ICD-10-CM | POA: Diagnosis not present

## 2020-01-31 DIAGNOSIS — I272 Pulmonary hypertension, unspecified: Secondary | ICD-10-CM | POA: Diagnosis not present

## 2020-01-31 DIAGNOSIS — N179 Acute kidney failure, unspecified: Secondary | ICD-10-CM | POA: Diagnosis not present

## 2020-01-31 DIAGNOSIS — M6281 Muscle weakness (generalized): Secondary | ICD-10-CM | POA: Diagnosis not present

## 2020-01-31 DIAGNOSIS — R41841 Cognitive communication deficit: Secondary | ICD-10-CM | POA: Diagnosis not present

## 2020-01-31 DIAGNOSIS — F4323 Adjustment disorder with mixed anxiety and depressed mood: Secondary | ICD-10-CM | POA: Diagnosis not present

## 2020-01-31 DIAGNOSIS — M1990 Unspecified osteoarthritis, unspecified site: Secondary | ICD-10-CM | POA: Diagnosis not present

## 2020-01-31 DIAGNOSIS — F0390 Unspecified dementia without behavioral disturbance: Secondary | ICD-10-CM | POA: Diagnosis not present

## 2020-02-07 DIAGNOSIS — F0390 Unspecified dementia without behavioral disturbance: Secondary | ICD-10-CM | POA: Diagnosis not present

## 2020-02-07 DIAGNOSIS — F4323 Adjustment disorder with mixed anxiety and depressed mood: Secondary | ICD-10-CM | POA: Diagnosis not present

## 2020-02-14 DIAGNOSIS — Z299 Encounter for prophylactic measures, unspecified: Secondary | ICD-10-CM | POA: Diagnosis not present

## 2020-02-14 DIAGNOSIS — R638 Other symptoms and signs concerning food and fluid intake: Secondary | ICD-10-CM | POA: Diagnosis not present

## 2020-02-14 DIAGNOSIS — I10 Essential (primary) hypertension: Secondary | ICD-10-CM | POA: Diagnosis not present

## 2020-02-14 DIAGNOSIS — F0391 Unspecified dementia with behavioral disturbance: Secondary | ICD-10-CM | POA: Diagnosis not present

## 2020-02-21 DIAGNOSIS — F0390 Unspecified dementia without behavioral disturbance: Secondary | ICD-10-CM | POA: Diagnosis not present

## 2020-02-21 DIAGNOSIS — F4323 Adjustment disorder with mixed anxiety and depressed mood: Secondary | ICD-10-CM | POA: Diagnosis not present

## 2020-03-20 DIAGNOSIS — F338 Other recurrent depressive disorders: Secondary | ICD-10-CM | POA: Diagnosis not present

## 2020-03-20 DIAGNOSIS — F0390 Unspecified dementia without behavioral disturbance: Secondary | ICD-10-CM | POA: Diagnosis not present

## 2020-03-24 DIAGNOSIS — N184 Chronic kidney disease, stage 4 (severe): Secondary | ICD-10-CM | POA: Diagnosis not present

## 2020-03-24 DIAGNOSIS — R531 Weakness: Secondary | ICD-10-CM | POA: Diagnosis not present

## 2020-03-24 DIAGNOSIS — E46 Unspecified protein-calorie malnutrition: Secondary | ICD-10-CM | POA: Diagnosis not present

## 2020-03-24 DIAGNOSIS — G311 Senile degeneration of brain, not elsewhere classified: Secondary | ICD-10-CM | POA: Diagnosis not present

## 2020-03-24 DIAGNOSIS — R630 Anorexia: Secondary | ICD-10-CM | POA: Diagnosis not present

## 2020-03-24 DIAGNOSIS — N3946 Mixed incontinence: Secondary | ICD-10-CM | POA: Diagnosis not present

## 2020-04-02 DEATH — deceased
